# Patient Record
Sex: Male | Born: 1935 | Race: White | Hispanic: No | Marital: Married | Smoking: Former smoker
Health system: Southern US, Community
[De-identification: ages and names within clinical notes are randomized; demographics above are authoritative.]

## PROBLEM LIST (undated history)

## (undated) DIAGNOSIS — Z86010 Personal history of colonic polyps: Secondary | ICD-10-CM

## (undated) DIAGNOSIS — I2510 Atherosclerotic heart disease of native coronary artery without angina pectoris: Secondary | ICD-10-CM

## (undated) DIAGNOSIS — K5790 Diverticulosis of intestine, part unspecified, without perforation or abscess without bleeding: Secondary | ICD-10-CM

## (undated) DIAGNOSIS — R569 Unspecified convulsions: Secondary | ICD-10-CM

## (undated) DIAGNOSIS — E785 Hyperlipidemia, unspecified: Secondary | ICD-10-CM

## (undated) DIAGNOSIS — C801 Malignant (primary) neoplasm, unspecified: Secondary | ICD-10-CM

## (undated) DIAGNOSIS — I10 Essential (primary) hypertension: Secondary | ICD-10-CM

## (undated) HISTORY — DX: Essential (primary) hypertension: I10

## (undated) HISTORY — PX: PROSTATE SURGERY: SHX751

## (undated) HISTORY — DX: Personal history of colonic polyps: Z86.010

## (undated) HISTORY — DX: Unspecified convulsions: R56.9

## (undated) HISTORY — DX: Hyperlipidemia, unspecified: E78.5

## (undated) HISTORY — DX: Atherosclerotic heart disease of native coronary artery without angina pectoris: I25.10

## (undated) HISTORY — DX: Malignant (primary) neoplasm, unspecified: C80.1

## (undated) HISTORY — DX: Diverticulosis of intestine, part unspecified, without perforation or abscess without bleeding: K57.90

---

## 2000-09-30 DIAGNOSIS — I2510 Atherosclerotic heart disease of native coronary artery without angina pectoris: Secondary | ICD-10-CM

## 2000-09-30 HISTORY — DX: Atherosclerotic heart disease of native coronary artery without angina pectoris: I25.10

## 2001-05-19 LAB — HM COLONOSCOPY: HM Colonoscopy: NORMAL

## 2002-09-30 DIAGNOSIS — Z86010 Personal history of colonic polyps: Secondary | ICD-10-CM

## 2002-09-30 HISTORY — DX: Personal history of colonic polyps: Z86.010

## 2005-09-30 DIAGNOSIS — K5790 Diverticulosis of intestine, part unspecified, without perforation or abscess without bleeding: Secondary | ICD-10-CM

## 2005-09-30 HISTORY — DX: Diverticulosis of intestine, part unspecified, without perforation or abscess without bleeding: K57.90

## 2005-12-02 ENCOUNTER — Ambulatory Visit: Payer: Self-pay | Admitting: Unknown Physician Specialty

## 2006-03-14 LAB — HM COLONOSCOPY

## 2006-03-17 ENCOUNTER — Ambulatory Visit: Payer: Self-pay | Admitting: Gastroenterology

## 2008-03-25 ENCOUNTER — Ambulatory Visit: Payer: Self-pay | Admitting: Unknown Physician Specialty

## 2008-03-26 ENCOUNTER — Ambulatory Visit: Payer: Self-pay | Admitting: Unknown Physician Specialty

## 2008-04-20 ENCOUNTER — Ambulatory Visit: Payer: Self-pay | Admitting: Unknown Physician Specialty

## 2008-06-20 ENCOUNTER — Ambulatory Visit: Payer: Self-pay

## 2009-04-10 ENCOUNTER — Ambulatory Visit: Payer: Self-pay | Admitting: Unknown Physician Specialty

## 2009-08-04 ENCOUNTER — Ambulatory Visit: Payer: Self-pay | Admitting: Unknown Physician Specialty

## 2009-08-07 ENCOUNTER — Ambulatory Visit: Payer: Self-pay | Admitting: Unknown Physician Specialty

## 2011-09-23 IMAGING — NM NUCLEAR MEDICINE WHOLE BODY BONE SCINTIGRAPHY
4 series · 16 of 16 positions shown · non-contrast
Comparison: none

REASON FOR EXAM: prostate CA    eval for mets   abnormal LFT's  anemia
COMMENTS:

[Series 1000: statics · 2.40mm/px · 3 acquisitions, 6 frames shown]
[im 1/3]
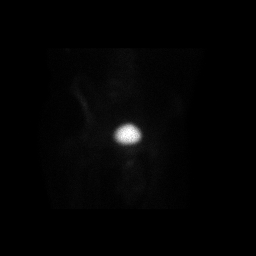
[im 1/3]
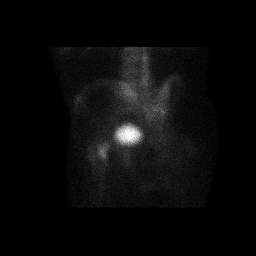
[im 2/3]
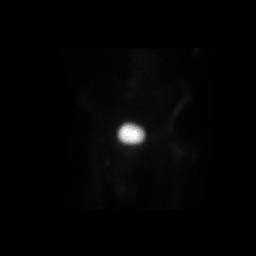
[im 2/3]
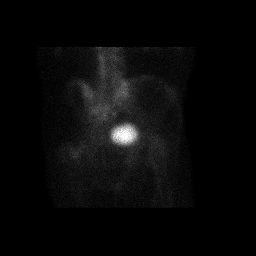
[im 3/3]
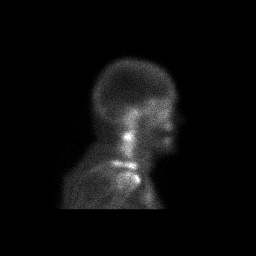
[im 3/3]
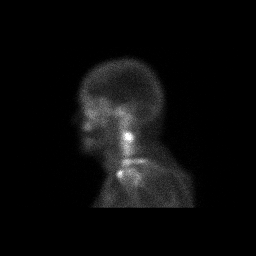

[Series 1000: statics (reformatted series) · 2.40mm/px · 3 acquisitions, 6 frames shown]
[im 1/3]
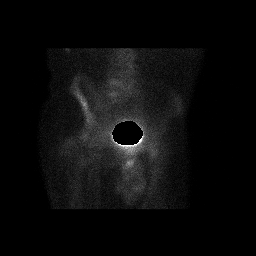
[im 1/3]
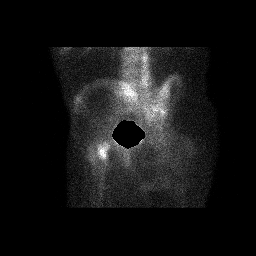
[im 2/3]
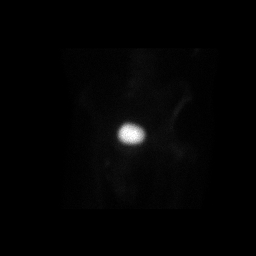
[im 2/3]
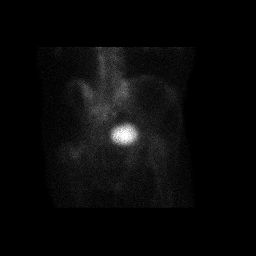
[im 3/3]
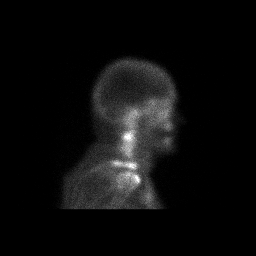
[im 3/3]
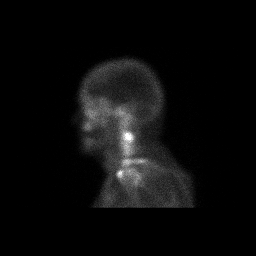

[Series 1000: 3 hr wholebody (reformatted series) · 2.40mm/px · 2 of 2 frames shown]
[frame 1/2]
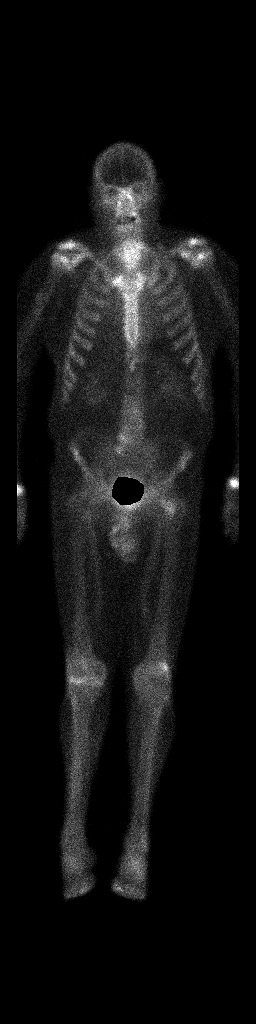
[frame 2/2]
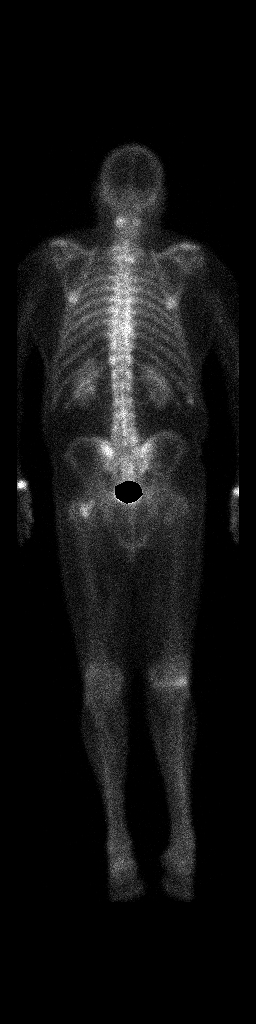

[Series 1000: 3 hr wholebody · 2.40mm/px · 2 of 2 frames shown]
[frame 1/2]
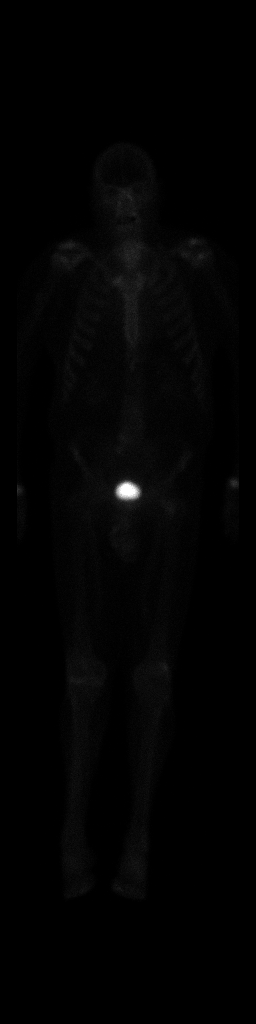
[frame 2/2]
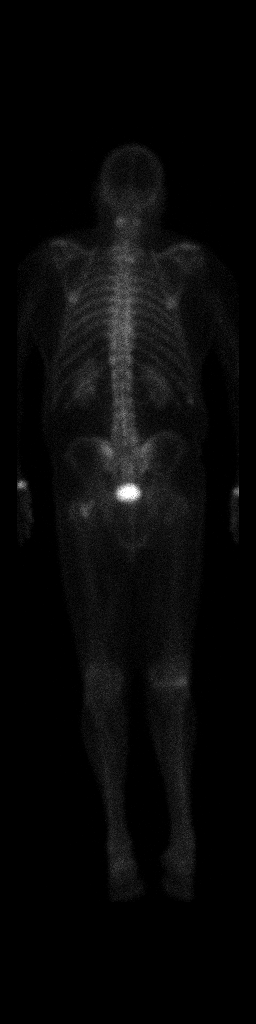

[16 of 16 positions shown; findings below may reference images not displayed]

PROCEDURE:     NM  - NM BONE WB 3 HR [DATE]  [DATE]

RESULT:     Following intravenous administration of 22.18 mCi technetium 99m
MDP, total body bone scan was performed. There is a nonspecific focal area
of increased tracer activity at the left femoral neck. Correlation with left
hip radiographs is recommended.

There are nonspecific foci of increased tracer activity in the lower lumbar
spine at L5 and S1. These are located laterally on the right and are most
compatible with degenerative change. Also noted is slight increase in tracer
activity at approximately T2 on the right and likewise compatible with
degenerative change. The distribution of tracer activity otherwise is
normal. Normal tracer activity is seen in both kidneys.
IMPRESSION: 1. There is a nonspecific focus of increased tracer activity in the left
femoral neck.
2. Incidental note is made of foci of increased tracer activity in the lower
lumbar spine and upper thoracic spine, most compatible with degenerative
change.

## 2011-11-20 ENCOUNTER — Encounter: Payer: Self-pay | Admitting: Internal Medicine

## 2011-11-20 ENCOUNTER — Ambulatory Visit (INDEPENDENT_AMBULATORY_CARE_PROVIDER_SITE_OTHER): Payer: Medicare Other | Admitting: Internal Medicine

## 2011-11-20 VITALS — BP 100/60 | HR 70 | Temp 97.8°F | Wt 157.0 lb

## 2011-11-20 DIAGNOSIS — C61 Malignant neoplasm of prostate: Secondary | ICD-10-CM

## 2011-11-20 DIAGNOSIS — I2510 Atherosclerotic heart disease of native coronary artery without angina pectoris: Secondary | ICD-10-CM

## 2011-11-20 DIAGNOSIS — Z79899 Other long term (current) drug therapy: Secondary | ICD-10-CM

## 2011-11-20 DIAGNOSIS — R569 Unspecified convulsions: Secondary | ICD-10-CM

## 2011-11-20 DIAGNOSIS — E785 Hyperlipidemia, unspecified: Secondary | ICD-10-CM

## 2011-11-20 DIAGNOSIS — I10 Essential (primary) hypertension: Secondary | ICD-10-CM

## 2011-11-20 DIAGNOSIS — C801 Malignant (primary) neoplasm, unspecified: Secondary | ICD-10-CM

## 2011-11-20 LAB — COMPREHENSIVE METABOLIC PANEL
ALT: 10 U/L (ref 0–53)
AST: 14 U/L (ref 0–37)
Alkaline Phosphatase: 45 U/L (ref 39–117)
CO2: 25 mEq/L (ref 19–32)
Creatinine, Ser: 0.9 mg/dL (ref 0.4–1.5)
Sodium: 137 mEq/L (ref 135–145)
Total Bilirubin: 0.4 mg/dL (ref 0.3–1.2)
Total Protein: 5.8 g/dL — ABNORMAL LOW (ref 6.0–8.3)

## 2011-11-20 NOTE — Progress Notes (Signed)
Subjective:    Patient ID: Todd Pierce, male    DOB: 10/25/1929, 78 y.o.   MRN: [id number]  HPI  78 yr old with history of metastatic Prostate CA ,  Treated currently with n hormonal suppression, on Xtandi , here to establish primary care. Prior PCP was [facility].  Primary oncologist is in [place],   Initially treated in 2002 , Gleason score  Of 9  per patient,  currently treated  By Dr Cheree Ditto in [place] (oncology),  Dr. Jessee Avers in [place],  orthopedic surgery planned for near future  To stabilize left hip at [facility] to stabilize hip which has bony mets.  (Dr Odis Luster)  .   History of idiopathic seizure disorder,  First episode occurred  2009 , managed with Keppra,  Neurologist in [place] [facility], lalso diagnosed with peripheral neuropathy causing  primary balance disorder and right foot drop. He has had 1 recent fall which occurred at home while using the oven.  No injuries sustained. Uses a cane  outside of the home in unfamiliar places .  He walks fairly well once up but has a few moments of orthostasis and vertigo with sudden changes in movement.  Had previous noted excessive salivation, which has improved.   No history of coughing or post prandial cough .  History of hypertension with reduced medications lately due to recurrent hypotension.  History of CAD  With  Known 95% blockage of LAD,  treated medically since 2002.  Followed locally by [facility].  No orthopnea, able to lie flat in bed.  Last CMEt and lipids checked by [facility] in Sept.  2012.  Past Medical History  Diagnosis Date  . prostate cancer   . Coronary artery disease, non-occlusive 2002    95% LAD lesion, managed medically  . Hypertension   . Hyperlipidemia   . Seizures    No current outpatient prescriptions on file prior to visit.        Review of Systems  Constitutional: Positive for fatigue. Negative for fever, chills, diaphoresis, activity change, appetite change and unexpected weight change.    HENT: Negative for hearing loss, ear pain, nosebleeds, congestion, sore throat, facial swelling, rhinorrhea, sneezing, drooling, mouth sores, trouble swallowing, neck pain, neck stiffness, dental problem, voice change, postnasal drip, sinus pressure, tinnitus and ear discharge.   Eyes: Negative for photophobia, pain, discharge, redness, itching and visual disturbance.  Respiratory: Negative for apnea, cough, choking, chest tightness, shortness of breath, wheezing and stridor.   Cardiovascular: Negative for chest pain, palpitations and leg swelling.  Gastrointestinal: Negative for nausea, vomiting, abdominal pain, diarrhea, constipation, blood in stool, abdominal distention, anal bleeding and rectal pain.  Genitourinary: Negative for dysuria, urgency, frequency, hematuria, flank pain, decreased urine volume, scrotal swelling, difficulty urinating and testicular pain.  Musculoskeletal: Positive for back pain. Negative for myalgias, joint swelling, arthralgias and gait problem.  Skin: Negative for color change, rash and wound.  Neurological: Positive for seizures and light-headedness. Negative for dizziness, tremors, syncope, speech difficulty, weakness, numbness and headaches.  Psychiatric/Behavioral: Positive for confusion. Negative for suicidal ideas, hallucinations, behavioral problems, sleep disturbance, dysphoric mood, decreased concentration and agitation. The patient is not nervous/anxious.        Objective:   Physical Exam  Constitutional: He is oriented to person, place, and time.  HENT:  Head: Normocephalic and atraumatic.  Mouth/Throat: Oropharynx is clear and moist.  Eyes: Conjunctivae and EOM are normal.  Neck: Normal range of motion. Neck supple. No JVD present. No thyromegaly  present.  Cardiovascular: Normal rate, regular rhythm and normal heart sounds.   Pulmonary/Chest: Effort normal and breath sounds normal. He has no wheezes. He has no rales.  Abdominal: Soft. Bowel sounds  are normal. He exhibits no mass. There is no tenderness. There is no rebound.  Musculoskeletal: Normal range of motion. He exhibits no edema.  Neurological: He is alert and oriented to person, place, and time.  Skin: Skin is warm and dry.  Psychiatric: He has a normal mood and affect.       Assessment & Plan:   Coronary artery disease, non-occlusive Followed locally by Jamse Mead for 95% lession of LAD found in 2002.  Continue statin, asa, beta blocker as tolerated by bp.  Fasting lipids due. He has no history of recent chest pain or orthopnea. Unclear why is is not on an ACE Inhibitor but may be due to hypotension reported earlier.  Records requested.   Hypertension Currently on beta blocker only.    Hyperlipidemia Managed with low dose pravastatin ,  Goal LDL < 70.  Lipids and CMET due   Prostate cancer, primary, with metastasis from prostate to other site With bony mets, managed with hormonal suppressive therapy.  He has been referred for left hip surgery to stabilize bone due to mets, but has not set this up yet. Records requested.   Seizures Secondary to metaastatic prostate CA, presumed.  No recent seizure activity. Records from [facility] requested,  Continue Keppra.     Updated Medication List Outpatient Encounter Prescriptions as of 11/20/2011  Medication Sig Dispense Refill  . aspirin 81 MG tablet Take 81 mg by mouth daily.      . cabergoline (DOSTINEX) 0.5 MG tablet Take one by mouth on Monday and Thursday.      . Calcium Citrate 250 MG TABS Take 3 by mouth twice a day      . Cholecalciferol (VITAMIN D-3) 5000 UNITS TABS Take one by mouth twice a day.      . Denosumab (XGEVA [place] [facility]) One injection monthly.      . dutasteride (AVODART) 0.5 MG capsule Take 0.5 mg by mouth daily.      Marland Kitchen estradiol (CLIMARA - DOSED IN MG/24 HR) 0.1 mg/24hr Wear 3 patches, add one and remove one daily      . hydrocortisone (CORTEF) 20 MG tablet Take one tablet in the morning and one  half tablet in the evening      . indomethacin (INDOCIN) 50 MG capsule Take by mouth as needed.      . Lecithin 1200 MG CAPS Take one by mouth daily      . leuprolide (LUPRON) 22.5 MG injection Inject 22.5 mg into the muscle every 3 (three) months.      . levETIRAcetam (KEPPRA) 500 MG tablet Take 500 mg by mouth every 12 (twelve) hours.      . Loratadine 10 MG CAPS Take one by mouth daily      . metoprolol succinate (TOPROL-XL) 25 MG 24 hr tablet Take one half by mouth daily      . Pomegranate, Pun[place] granatum, (POMEGRANATE PO) Take by mouth.      . pravastatin (PRAVACHOL) 20 MG tablet Take 20 mg by mouth daily.      Marland Kitchen Resveratrol 250 MG CAPS Take by mouth.      Judi Saa OIL Take 3 capsules by mouth twice a day- total of 2400 mg's.      . sargramostim (LEUKINE) 250 MCG SOLR Inject  into the skin daily.      . TURMERIC CURCUMIN PO Take 400 mg's daily      . ursodiol (ACTIGALL) 300 MG capsule Take 300 mg by mouth 2 (two) times daily.

## 2011-11-20 NOTE — Patient Instructions (Signed)
Try Simply Saline twice daily to flush sinuses  twice daily to prevent sinus infection   We are checking your liver and kidney function and will call you with the results.

## 2011-11-23 ENCOUNTER — Encounter: Payer: Self-pay | Admitting: Internal Medicine

## 2011-11-23 DIAGNOSIS — I10 Essential (primary) hypertension: Secondary | ICD-10-CM | POA: Insufficient documentation

## 2011-11-23 DIAGNOSIS — C61 Malignant neoplasm of prostate: Secondary | ICD-10-CM | POA: Insufficient documentation

## 2011-11-23 DIAGNOSIS — I2510 Atherosclerotic heart disease of native coronary artery without angina pectoris: Secondary | ICD-10-CM | POA: Insufficient documentation

## 2011-11-23 DIAGNOSIS — R569 Unspecified convulsions: Secondary | ICD-10-CM | POA: Insufficient documentation

## 2011-11-23 DIAGNOSIS — E785 Hyperlipidemia, unspecified: Secondary | ICD-10-CM | POA: Insufficient documentation

## 2011-11-23 NOTE — Assessment & Plan Note (Signed)
Currently on beta blocker only.

## 2011-11-23 NOTE — Assessment & Plan Note (Signed)
Managed with low dose pravastatin ,  Goal LDL < 70.  Lipids and CMET due

## 2011-11-23 NOTE — Assessment & Plan Note (Addendum)
Followed locally by Jamse Mead for 95% lession of LAD found in 2002.  Continue statin, asa, beta blocker as tolerated by bp.  Fasting lipids due. He has no history of recent chest pain or orthopnea. Unclear why is is not on an ACE Inhibitor but may be due to hypotension reported earlier.  Records requested.

## 2011-11-23 NOTE — Assessment & Plan Note (Signed)
Secondary to metaastatic prostate CA, presumed.  No recent seizure activity. Records from [facility] Neurology requested,  Continue Keppra.

## 2011-11-23 NOTE — Assessment & Plan Note (Signed)
With bony mets, managed with hormonal suppressive therapy.  He has been referred for left hip surgery to stabilize bone due to mets, but has not set this up yet. Records requested.

## 2012-04-15 ENCOUNTER — Other Ambulatory Visit: Payer: Self-pay | Admitting: *Deleted

## 2012-04-15 MED ORDER — PRAVASTATIN SODIUM 20 MG PO TABS: 20.0000 mg | ORAL_TABLET | Freq: Every day | ORAL | Status: DC

## 2012-04-15 NOTE — Telephone Encounter (Signed)
Pt called for refill of Pravastatin. Rx sent to pharmacy, pt informed.

## 2012-05-19 ENCOUNTER — Encounter: Payer: Self-pay | Admitting: Internal Medicine

## 2012-05-19 ENCOUNTER — Ambulatory Visit (INDEPENDENT_AMBULATORY_CARE_PROVIDER_SITE_OTHER): Payer: Medicare Other | Admitting: Internal Medicine

## 2012-05-19 VITALS — BP 102/58 | HR 64 | Temp 97.7°F | Resp 14 | Wt 169.0 lb

## 2012-05-19 DIAGNOSIS — R569 Unspecified convulsions: Secondary | ICD-10-CM

## 2012-05-19 DIAGNOSIS — I10 Essential (primary) hypertension: Secondary | ICD-10-CM

## 2012-05-19 DIAGNOSIS — R5381 Other malaise: Secondary | ICD-10-CM

## 2012-05-19 DIAGNOSIS — Z800 Family history of malignant neoplasm of digestive organs: Secondary | ICD-10-CM

## 2012-05-19 DIAGNOSIS — R5383 Other fatigue: Secondary | ICD-10-CM

## 2012-05-19 DIAGNOSIS — E785 Hyperlipidemia, unspecified: Secondary | ICD-10-CM

## 2012-05-19 DIAGNOSIS — C61 Malignant neoplasm of prostate: Secondary | ICD-10-CM

## 2012-05-19 DIAGNOSIS — C801 Malignant (primary) neoplasm, unspecified: Secondary | ICD-10-CM

## 2012-05-19 NOTE — Patient Instructions (Addendum)
We are going to suspend your metoprolol because your blood pressure is bordering on too low   You may resume it for bp > 140   You have no signs of diabetes by your last several serum glucose measurements  Return in 6 months, sooner if your blood pressure becomes a problem

## 2012-05-19 NOTE — Progress Notes (Signed)
Patient ID: Todd Pierce, male   DOB: October 27, 1929, 78 y.o.   MRN: [id number]  Patient Active Problem List  Diagnosis  . Coronary artery disease, non-occlusive  . Hypertension  . Hyperlipidemia  . Seizures  . Prostate cancer, primary, with metastasis from prostate to other site    Subjective:  CC:   Chief Complaint  Patient presents with  . Follow-up    6 month    HPI:   Todd Pierce a 78 y.o. male who presents Followup on chronic conditions. He is accompanied by his wife today who reports independently from him that he has been having some control with his mentation. She describes it as a mental fog affecting short-term memory and attributes it to the treatment he is getting for metastatic  prostate cancer. His prostate cancer has metastasized to bone and he has been receiving xtandi since he had progression on Xgeva. His therapies directed by Dr. Izola Price and Dr. Cheree Ditto.  He has been having nocturia  x 3 but takes a nap during day to catch up on sleep .  Appetite ok   Has not lost weight,  [facility] focuses this is walking and doing upper body weights. He is aware that Lupron can cause osteoporosis and has been doing weight bearing exercises intentionally.he is overdue for his his regular colonoscopy screening but is considering foregoing this procedure. He has 2 brothers who were diagnosed with colon cancer in their 73s.  No bowel problems except constipation occasionally,  Uses sennakot daily to move bowels.  2) right foot swells,  Responds to elevation .  History of prior surgeries on his right knee DJD with Synvisc injections,.  Not in constant pain .  Peripheral neuropathy chronic.     Past Medical History  Diagnosis Date  . prostate cancer   . Coronary artery disease, non-occlusive 2002    95% LAD lesion, managed medically  . Hypertension   . Hyperlipidemia   . Seizures     Past Surgical History  Procedure Date  . Prostate surgery           The following portions of the patient's history were reviewed and updated as appropriate: Allergies, current medications, and problem list.    Review of Systems:   A comprehensive ROS was done and positive for nocturia and daytime fatigue.   The rest was negative.     History   Social History  . Marital Status: Married    Spouse Name: N/A    Number of Children: N/A  . Years of Education: N/A   Occupational History  . Not on file.   Social History Main Topics  . Smoking status: Former Smoker    Types: Cigarettes    Quit date: 05/19/1962  . Smokeless tobacco: Former Neurosurgeon    Quit date: 05/19/1952  . Alcohol Use: Yes  . Drug Use: No  . Sexually Active: Not on file   Other Topics Concern  . Not on file   Social History Narrative  . No narrative on file    Objective:  BP 102/58  Pulse 64  Temp 97.7 F (36.5 C) (Oral)  Resp 14  Wt 169 lb (76.658 kg)  SpO2 97%  General appearance: alert, cooperative and appears stated age Ears: normal TM's and external ear canals both ears Throat: lips, mucosa, and tongue normal; teeth and gums normal Neck: no adenopathy, no carotid bruit, supple, symmetrical, trachea midline and thyroid not enlarged, symmetric, no  tenderness/mass/nodules Back: symmetric, no curvature. ROM normal. No CVA tenderness. Lungs: clear to auscultation bilaterally Heart: regular rate and rhythm, S1, S2 normal, no murmur, click, rub or gallop Abdomen: soft, non-tender; bowel sounds normal; no masses,  no organomegaly Pulses: 2+ and symmetric Skin: Skin color, texture, turgor normal. No rashes or lesions Lymph nodes: Cervical, supraclavicular, and axillary nodes normal.  Assessment and Plan:  Hypertension Well controlled on current regimen. In fact he seems to his systolic pressure of 105 is high for him. I decided to stop his a walker today as this maybe contributing to his mental fatigue and physical fatigue.  Hyperlipidemia Managed  with pravastatin goal LDL less than 100. He had liver function test done in July which were normal.  Seizures No recent seizure activity. Continue current medications.  Fatigue Improving. He had a CBC in July which was normal. We are stopping his metoprolol today since his blood pressures are well controlled. This should help.  Prostate cancer, primary, with metastasis from prostate to other site With bone metastasis. He underwent fixation of his left femur to prevent pathologic fracture. Is currently receiving Xtandiby his oncologist which has slowed the rise in his PSA.  Family history of colon cancer requiring screening colonoscopy He is scheduled to see his Dr. Marva Panda next month to discuss colonoscopy. Given his metastatic prostate cancer I do not think you who will serve to continue screening for colon cancer as he was having difficult time tolerating treatment.   Updated Medication List Outpatient Encounter Prescriptions as of 05/19/2012  Medication Sig Dispense Refill  . aspirin 81 MG tablet Take 81 mg by mouth daily.      . cabergoline (DOSTINEX) 0.5 MG tablet Take one by mouth on Monday and Thursday.      . Calcium Citrate 250 MG TABS Take 3 by mouth twice a day      . Cholecalciferol (VITAMIN D-3) 5000 UNITS TABS Take one by mouth twice a day.      . Denosumab (XGEVA [place]) One injection monthly.      . dutasteride (AVODART) 0.5 MG capsule Take 0.5 mg by mouth daily.      . Enzalutamide (XTANDI PO) Take 160 mg by mouth.      . estradiol (CLIMARA - DOSED IN MG/24 HR) 0.1 mg/24hr Wear 3 patches, add one and remove one daily      . indomethacin (INDOCIN) 50 MG capsule Take by mouth as needed.      . Lecithin 1200 MG CAPS Take one by mouth daily      . leuprolide (LUPRON) 22.5 MG injection Inject 22.5 mg into the muscle every 3 (three) months.      . levETIRAcetam (KEPPRA) 500 MG tablet Take 500 mg by mouth every 12 (twelve) hours.      . Loratadine 10 MG CAPS Take one by mouth  daily      . Pomegranate, Punica granatum, (POMEGRANATE PO) Take by mouth.      . pravastatin (PRAVACHOL) 20 MG tablet Take 1 tablet (20 mg total) by mouth daily.  30 tablet  5  . Resveratrol 250 MG CAPS Take by mouth.      Judi Saa OIL Take 3 capsules by mouth twice a day- total of 2400 mg's.      . sargramostim (LEUKINE) 250 MCG SOLR Inject into the skin daily.      . TURMERIC CURCUMIN PO Take 400 mg's daily      . ursodiol (ACTIGALL) 300 MG  capsule Take 300 mg by mouth 2 (two) times daily.      Marland Kitchen DISCONTD: metoprolol succinate (TOPROL-XL) 25 MG 24 hr tablet Take one half by mouth daily      . DISCONTD: hydrocortisone (CORTEF) 20 MG tablet Take one tablet in the morning and one half tablet in the evening

## 2012-05-20 ENCOUNTER — Encounter: Payer: Self-pay | Admitting: Internal Medicine

## 2012-05-20 DIAGNOSIS — R5383 Other fatigue: Secondary | ICD-10-CM | POA: Insufficient documentation

## 2012-05-20 DIAGNOSIS — Z800 Family history of malignant neoplasm of digestive organs: Secondary | ICD-10-CM | POA: Insufficient documentation

## 2012-05-20 NOTE — Assessment & Plan Note (Addendum)
Managed with pravastatin goal LDL less than 100. He had liver function test done in July which were normal.

## 2012-05-20 NOTE — Assessment & Plan Note (Signed)
Well controlled on current regimen. In fact he seems to his systolic pressure of 105 is high for him. I decided to stop his a walker today as this maybe contributing to his mental fatigue and physical fatigue.

## 2012-05-20 NOTE — Assessment & Plan Note (Signed)
No recent seizure activity. Continue current medications.

## 2012-05-20 NOTE — Assessment & Plan Note (Signed)
Improving. He had a CBC in July which was normal. We are stopping his metoprolol today since his blood pressures are well controlled. This should help.

## 2012-05-20 NOTE — Assessment & Plan Note (Signed)
With bone metastasis. He underwent fixation of his left femur to prevent pathologic fracture. Is currently receiving Xtandiby his oncologist which has slowed the rise in his PSA.

## 2012-05-20 NOTE — Assessment & Plan Note (Signed)
He is scheduled to see his Dr. Marva Panda next month to discuss colonoscopy. Given his metastatic prostate cancer I do not think you who will serve to continue screening for colon cancer as he was having difficult time tolerating treatment.

## 2012-08-14 ENCOUNTER — Telehealth: Payer: Self-pay | Admitting: Internal Medicine

## 2012-08-14 ENCOUNTER — Encounter: Payer: Self-pay | Admitting: Internal Medicine

## 2012-08-14 ENCOUNTER — Ambulatory Visit (INDEPENDENT_AMBULATORY_CARE_PROVIDER_SITE_OTHER): Payer: Medicare Other | Admitting: Internal Medicine

## 2012-08-14 VITALS — BP 98/60 | HR 67 | Temp 97.3°F | Resp 12 | Ht 69.0 in | Wt 152.8 lb

## 2012-08-14 DIAGNOSIS — R5381 Other malaise: Secondary | ICD-10-CM

## 2012-08-14 DIAGNOSIS — D649 Anemia, unspecified: Secondary | ICD-10-CM | POA: Insufficient documentation

## 2012-08-14 DIAGNOSIS — Z86010 Personal history of colonic polyps: Secondary | ICD-10-CM | POA: Insufficient documentation

## 2012-08-14 DIAGNOSIS — C801 Malignant (primary) neoplasm, unspecified: Secondary | ICD-10-CM

## 2012-08-14 DIAGNOSIS — C61 Malignant neoplasm of prostate: Secondary | ICD-10-CM

## 2012-08-14 DIAGNOSIS — K5790 Diverticulosis of intestine, part unspecified, without perforation or abscess without bleeding: Secondary | ICD-10-CM | POA: Insufficient documentation

## 2012-08-14 DIAGNOSIS — R5383 Other fatigue: Secondary | ICD-10-CM

## 2012-08-14 DIAGNOSIS — E2740 Unspecified adrenocortical insufficiency: Secondary | ICD-10-CM | POA: Insufficient documentation

## 2012-08-14 DIAGNOSIS — Z800 Family history of malignant neoplasm of digestive organs: Secondary | ICD-10-CM

## 2012-08-14 DIAGNOSIS — R350 Frequency of micturition: Secondary | ICD-10-CM | POA: Insufficient documentation

## 2012-08-14 DIAGNOSIS — T380X5A Adverse effect of glucocorticoids and synthetic analogues, initial encounter: Secondary | ICD-10-CM

## 2012-08-14 DIAGNOSIS — E2749 Other adrenocortical insufficiency: Secondary | ICD-10-CM

## 2012-08-14 LAB — POCT URINALYSIS DIPSTICK
Bilirubin, UA: NEGATIVE
Glucose, UA: NEGATIVE
Spec Grav, UA: 1.02
Urobilinogen, UA: 0.2

## 2012-08-14 LAB — POCT UA - GLUCOSE/PROTEIN: Protein, UA: 30

## 2012-08-14 MED ORDER — CIPROFLOXACIN HCL 250 MG PO TABS: 250.0000 mg | ORAL_TABLET | Freq: Two times a day (BID) | ORAL | Status: DC

## 2012-08-14 MED ORDER — ABIRATERONE ACETATE 250 MG PO TABS: 1000.0000 mg | ORAL_TABLET | Freq: Every day | ORAL | Status: AC

## 2012-08-14 MED ORDER — PREDNISONE 5 MG PO TABS: 5.0000 mg | ORAL_TABLET | Freq: Two times a day (BID) | ORAL | Status: AC

## 2012-08-14 NOTE — Telephone Encounter (Signed)
Wanted to speak with a nurse about upcoming cancer treatments

## 2012-08-14 NOTE — Assessment & Plan Note (Addendum)
Diagnosed with Stage II in 2002, revised Gleason score of 9 .  Long list of treatments including XRT in 2012 , Lupron and Denusomab in 2012, followed by [facility] with progression of bony mets .  To start Xofigo in Dec.

## 2012-08-14 NOTE — Telephone Encounter (Signed)
Returned call to patietn's wife re use of cipro concurrently with Zytiga.  She was under the imperession from her pharmacist that Todd Pierce could not be combined with any antibacterials or antifungals.  Told her not to start the cipro or the urinary frequency medications until urine culture was resulted and we could determine if there were any contraindications to concurrent use.

## 2012-08-14 NOTE — Assessment & Plan Note (Signed)
His last colonoscopy was 2007 and normal.  followup every 3 years was advised but given his prognosis we have not pursued this.

## 2012-08-14 NOTE — Telephone Encounter (Signed)
Spoke  to patient he wanted to talk about a blood transfusion, advised patient to schedule appt to discuss with Dr. Darrick Huntsman

## 2012-08-14 NOTE — Assessment & Plan Note (Addendum)
Patient is concerned he needs a blood transfusion.

## 2012-08-14 NOTE — Telephone Encounter (Signed)
Gave patient appt for today.

## 2012-08-14 NOTE — Patient Instructions (Addendum)
Trial of different bladder agents to help your frequency  If your urine culture suggests UTI,  I will call in an antibiotic   Referral to Hematology local to streamline any future need for transfusion.

## 2012-08-14 NOTE — Telephone Encounter (Signed)
Please give patient an appt this afternoon.  I cannot order a blood transfusion without a CBC and he has not been seen since August.

## 2012-08-14 NOTE — Assessment & Plan Note (Addendum)
His UA suggests UTI ,  And since he has been suppressed with prednisone,  Will treat empirically with ciprfloxacin for two weeks.  Samples of vesicare, enablex and toviaz given  To try if the frequency does not imporve with treatment of UTI>

## 2012-08-14 NOTE — Progress Notes (Addendum)
Patient ID: Todd Pierce, male   DOB: 01/04/1930, 78 y.o.   MRN: [id number]  Patient Active Problem List  Diagnosis  . Coronary artery disease, non-occlusive  . Hypertension  . Hyperlipidemia  . Seizures  . Prostate cancer, primary, with metastasis from prostate to other site  . Fatigue  . Family history of colon cancer requiring screening colonoscopy  . Anemia  . Hx of adenomatous colonic polyps  . Diverticulosis  . Urinary frequency  . Steroid-induced adrenal suppression    Subjective:  CC:   No chief complaint on file.   HPI:   Todd Dupreeis a 78 y.o. male who presents  for follow up on prostate Ca and associated symptoms.  He has had progression of bony mets on [place] and is preparing to start  Xofigo, a new liquid radium infusion on Dec 12 with his oncologist in [place]. The treatment, if tolerated will take 6 months.  The radiation oncologist has recommended local surveillance of his anemia since he may need periodic transfusions.  His preprocedure hgb was 9.8 drawn on Nov 12,  Down from 10.7 in July.  2) urinary urgency with incomplete emptying and nocturia x 5.  Sleeps in recliner due to back pain  from mets. Using a bedside commode which has greatly aided since he has significant neuropathy  And moves very slowly.  However, the future treatments will make his urine radioactive and he  Has been advised not to use a bedside commode.   Past Medical History  Diagnosis Date  . prostate cancer   . Coronary artery disease, non-occlusive 2002    95% LAD lesion, managed medically  . Hypertension   . Hyperlipidemia   . Seizures   . Hx of adenomatous colonic polyps 2004    none seen on 2007bcolonoscopy  . Diverticulosis 2007    Past Surgical History  Procedure Date  . Prostate surgery          The following portions of the patient's history were reviewed and updated as appropriate: Allergies, current medications, and problem list.    Review of Systems:   12 Pt   review of systems was negative except those addressed in the HPI,     History   Social History  . Marital Status: Married    Spouse Name: N/A    Number of Children: N/A  . Years of Education: N/A   Occupational History  . Not on file.   Social History Main Topics  . Smoking status: Former Smoker    Types: Cigarettes    Quit date: 05/19/1962  . Smokeless tobacco: Former Neurosurgeon    Quit date: 05/19/1952  . Alcohol Use: Yes  . Drug Use: No  . Sexually Active: Not on file   Other Topics Concern  . Not on file   Social History Narrative  . No narrative on file    Objective:  BP 98/60  Pulse 67  Temp 97.3 F (36.3 C) (Oral)  Resp 12  Ht 5\' 9"  (1.753 m)  Wt 152 lb 12 oz (69.287 kg)  BMI 22.56 kg/m2  SpO2 98%  General appearance: alert, cooperative and appears stated age Ears: normal TM's and external ear canals both ears Throat: lips, mucosa, and tongue normal; teeth and gums normal Neck: no adenopathy, no carotid bruit, supple, symmetrical, trachea midline and thyroid not enlarged, symmetric, no tenderness/mass/nodules Back: symmetric, no curvature. ROM normal. No CVA tenderness. Lungs: clear to auscultation bilaterally Heart: regular rate and rhythm, S1, S2 normal, no  murmur, click, rub or gallop Abdomen: soft, non-tender; bowel sounds normal; no masses,  no organomegaly Pulses: 2+ and symmetric Skin: Skin color, texture, turgor normal. No rashes or lesions Lymph nodes: Cervical, supraclavicular, and axillary nodes normal.  Assessment and Plan:  Prostate cancer, primary, with metastasis from prostate to other site Diagnosed with Stage II in 2002, revised Gleason score of 9 .  Long list of treatments including XRT in 2012 , Lupron and Denusomab in 2012, followed by [facility] with progression of bony mets .  To start Xofigo in Dec.    Fatigue Patient is concerned he needs a blood transfusion.    Anemia hgb was 10.8 by outside labs in July and is now 9.8, likely  secondary to metastatic prostate Ca or side effect of his treatment.  Referral to [facility] for local follow up in the event that his future radium infusions cause rapid drops in counts needing transfusions.   Urinary frequency His UA suggests UTI ,  And since he has been suppressed with prednisone,  Will treat empirically with ciprfloxacin for two weeks.  Samples of vesicare, enablex and Gala Murdoch given  To try if the frequency does not imporve with treatment of UTI>   Family history of colon cancer requiring screening colonoscopy His last colonoscopy was 2007 and normal.  followup every 3 years was advised but given his prognosis we have not pursued this.   Steroid-induced adrenal suppression He is taking 10 mg of prednisone daily as adjunctive therapy to the Zytiga.  Advised patient and wife to let any ER or Urgent care  physician know this .    Updated Medication List Outpatient Encounter Prescriptions as of 08/14/2012  Medication Sig Dispense Refill  . abiraterone Acetate (ZYTIGA) 250 MG tablet Take 4 tablets (1,000 mg total) by mouth daily. Take on an empty stomach 1 hour before or 2 hours after a meal  120 tablet  0  . aspirin 81 MG tablet Take 81 mg by mouth daily.      . cabergoline (DOSTINEX) 0.5 MG tablet Take one by mouth on Monday and Thursday.      . Calcium Citrate 250 MG TABS Take 3 by mouth twice a day      . Cholecalciferol (VITAMIN D-3) 5000 UNITS TABS Take one by mouth twice a day.      . ciprofloxacin (CIPRO) 250 MG tablet Take 1 tablet (250 mg total) by mouth 2 (two) times daily.  28 tablet  0  . Denosumab (XGEVA [place]) One injection monthly.      . dutasteride (AVODART) 0.5 MG capsule Take 0.5 mg by mouth daily.      Marland Kitchen estradiol (CLIMARA - DOSED IN MG/24 HR) 0.1 mg/24hr Wear 3 patches, add one and remove one daily      . indomethacin (INDOCIN) 50 MG capsule Take by mouth as needed.      . Lecithin 1200 MG CAPS Take one by mouth daily      . leuprolide (LUPRON) 22.5 MG  injection Inject 22.5 mg into the muscle every 3 (three) months.      . levETIRAcetam (KEPPRA) 500 MG tablet Take 500 mg by mouth every 12 (twelve) hours.      . Loratadine 10 MG CAPS Take one by mouth daily      . Pomegranate, [place] granatum, (POMEGRANATE PO) Take by mouth.      . pravastatin (PRAVACHOL) 20 MG tablet Take 1 tablet (20 mg total) by mouth daily.  30 tablet  5  . predniSONE (DELTASONE) 5 MG tablet Take 1 tablet (5 mg total) by mouth 2 (two) times daily.  60 tablet  3  . Resveratrol 250 MG CAPS Take by mouth.      Judi Saa OIL Take 3 capsules by mouth twice a day- total of 2400 mg's.      . TURMERIC CURCUMIN PO Take 400 mg's daily      . ursodiol (ACTIGALL) 300 MG capsule Take 300 mg by mouth 2 (two) times daily.      . [DISCONTINUED] Enzalutamide (XTANDI PO) Take 160 mg by mouth.      . [DISCONTINUED] sargramostim (LEUKINE) 250 MCG SOLR Inject into the skin daily.

## 2012-08-14 NOTE — Assessment & Plan Note (Addendum)
hgb was 10.8 by outside labs in July and is now 9.8, likely secondary to metastatic prostate Ca or side effect of his treatment.  Referral to [facility] for local follow up in the event that his future radium infusions cause rapid drops in counts needing transfusions.

## 2012-08-14 NOTE — Telephone Encounter (Signed)
Patient needing a call from Dr. Darrick Huntsman thinking he may have to have a blood transfusion.

## 2012-08-14 NOTE — Telephone Encounter (Signed)
Spoke to patient spouse, she will have him to start the Cipro. I will call back on Monday to follow up with how he is doing.

## 2012-08-14 NOTE — Telephone Encounter (Signed)
His urine does suggest he has a uti and I dont' like the idea of  him gong the whole weekend without antibiotics and starting a medication that is going to retain his urine so I am advising that he start cipro.  I have called rx to his local pharmacy

## 2012-08-14 NOTE — Assessment & Plan Note (Addendum)
He is taking 10 mg of prednisone daily as adjunctive therapy to the Zytiga.  Advised patient and wife to let any ER or Urgent care  physician know this .

## 2012-08-17 ENCOUNTER — Encounter: Payer: Self-pay | Admitting: Internal Medicine

## 2012-08-17 ENCOUNTER — Ambulatory Visit: Payer: Medicare Other | Admitting: Internal Medicine

## 2012-08-17 LAB — URINE CULTURE

## 2012-08-24 ENCOUNTER — Other Ambulatory Visit (INDEPENDENT_AMBULATORY_CARE_PROVIDER_SITE_OTHER): Payer: Medicare Other | Admitting: *Deleted

## 2012-08-24 DIAGNOSIS — N390 Urinary tract infection, site not specified: Secondary | ICD-10-CM

## 2012-08-24 LAB — POCT URINALYSIS DIPSTICK
Ketones, UA: NEGATIVE
Leukocytes, UA: NEGATIVE
Nitrite, UA: NEGATIVE
Protein, UA: NEGATIVE
pH, UA: 6

## 2012-10-14 ENCOUNTER — Ambulatory Visit (INDEPENDENT_AMBULATORY_CARE_PROVIDER_SITE_OTHER): Payer: 59 | Admitting: Internal Medicine

## 2012-10-14 ENCOUNTER — Encounter: Payer: Self-pay | Admitting: Internal Medicine

## 2012-10-14 VITALS — BP 102/60 | HR 77 | Temp 98.1°F | Resp 16 | Wt 153.5 lb

## 2012-10-14 DIAGNOSIS — L8992 Pressure ulcer of unspecified site, stage 2: Secondary | ICD-10-CM

## 2012-10-14 DIAGNOSIS — L89302 Pressure ulcer of unspecified buttock, stage 2: Secondary | ICD-10-CM

## 2012-10-14 DIAGNOSIS — L89309 Pressure ulcer of unspecified buttock, unspecified stage: Secondary | ICD-10-CM

## 2012-10-14 DIAGNOSIS — K5900 Constipation, unspecified: Secondary | ICD-10-CM

## 2012-10-14 MED ORDER — SODIUM CHLORIDE 0.9 % IR SOLN: 1000.0000 mL | Freq: Once | Status: AC

## 2012-10-14 NOTE — Progress Notes (Signed)
Patient ID: Todd Pierce, male   DOB: 07/03/1930, 79 y.o.   MRN: [id number]    Patient Active Problem List  Diagnosis  . Coronary artery disease, non-occlusive  . Hypertension  . Hyperlipidemia  . Seizures  . Prostate cancer, primary, with metastasis from prostate to other site  . Family history of colon cancer requiring screening colonoscopy  . Anemia  . Hx of adenomatous colonic polyps  . Diverticulosis  . Urinary frequency  . Steroid-induced adrenal suppression  . Decubitus ulcer of buttock, stage 2  . Constipation    Subjective:  CC:   Chief Complaint  Patient presents with  . Pressure sore    HPI:   Todd Dupreeis a 79 y.o. male who presents Followup on recent UTI. Patient was diagnosed with UTI at last visit and was treated with ciprofloxacin after consideration of the available alternatives given his current chemotherapy. He subsequently was hospitalized at [facility] for generalized weakness and severe constipation. She believes that his profound weakness may have been due to the use of ciprofloxacin. His constipation was treated with enemas and liquid cathartics. He did not require manual disimpaction.  2)  recent development of sacral decubitus ulcer. Wife and patient had noted one month or more of a persistent ulcerated area on right buttock near the natal cleft.  His wife has been treating it with silver Silvadene without any major improvement. She has caused more skin trauma from adhesive bandages and has switched to non-he's in bandages. Patient does not note any pain. He has not been having a diarrhea he has had some constipation recently. O\    Past Medical History  Diagnosis Date  . prostate cancer   . Coronary artery disease, non-occlusive 2002    95% LAD lesion, managed medically  . Hypertension   . Hyperlipidemia   . Seizures   . Hx of adenomatous colonic polyps 2004    none seen on 2007bcolonoscopy  . Diverticulosis 2007    Past Surgical History    Procedure Date  . Prostate surgery          The following portions of the patient's history were reviewed and updated as appropriate: Allergies, current medications, and problem list.    Review of Systems:   12 Pt  review of systems was negative except those addressed in the HPI,     History   Social History  . Marital Status: Married    Spouse Name: N/A    Number of Children: N/A  . Years of Education: N/A   Occupational History  . Not on file.   Social History Main Topics  . Smoking status: Former Smoker    Types: Cigarettes    Quit date: 05/19/1962  . Smokeless tobacco: Former Neurosurgeon    Quit date: 05/19/1952  . Alcohol Use: Yes  . Drug Use: No  . Sexually Active: Not on file   Other Topics Concern  . Not on file   Social History Narrative  . No narrative on file    Objective:  BP 102/60  Pulse 77  Temp 98.1 F (36.7 C) (Oral)  Resp 16  Wt 153 lb 8 oz (69.627 kg)  SpO2 97%  General appearance: alert, cooperative and appears stated age. cachectic Ears: normal TM's and external ear canals both ears Throat: lips, mucosa, and tongue normal; teeth and gums normal Neck: no adenopathy, no carotid bruit, supple, symmetrical, trachea midline and thyroid not enlarged, symmetric, no tenderness/mass/nodules Back: symmetric, no curvature. ROM normal. No  CVA tenderness. Lungs: clear to auscultation bilaterally Heart: regular rate and rhythm, S1, S2 normal, no murmur, click, rub or gallop Abdomen: soft, non-tender; bowel sounds normal; no masses,  no organomegaly Pulses: 2+ and symmetric Skin:  Nickel-sized stage II decubitus ulcer, right buttock, near natal cleft. Evaluating tissue. No undermining. Tried a urine skin adherent to border at 2:00.  Lymph nodes: Cervical, supraclavicular, and axillary nodes normal.  Assessment and Plan:  Decubitus ulcer of buttock, stage 2 Secondary to malnutrition from metastatic cancer and prolonged sitting. His ulcer does  not show surrounding cellulitis. There is good granulation tissue in the base of it there is some dried skin that is adherent to one border of the ulcer. I recommended that his wife avoid tearing the skin but he should use petroleum jelly on the dried skin and continue night he's a dressing to be changed twice daily. We discussed use of Duoderm but decided against using it. Recommended adding silver Silvadene if she notes swelling of the area from feces. Also recommended shifting weight every 2 hours to prevent recurrent ulcers.  Constipation Currently resolved.   Updated Medication List Outpatient Encounter Prescriptions as of 10/14/2012  Medication Sig Dispense Refill  . abiraterone Acetate (ZYTIGA) 250 MG tablet Take 4 tablets (1,000 mg total) by mouth daily. Take on an empty stomach 1 hour before or 2 hours after a meal  120 tablet  0  . aspirin 81 MG tablet Take 81 mg by mouth daily.      . Calcium Citrate 250 MG TABS Take 3 by mouth twice a day      . Celecoxib (CELEBREX PO) Take by mouth 2 (two) times daily.      . Cholecalciferol (VITAMIN D-3) 5000 UNITS TABS Take one by mouth twice a day.      . Denosumab (XGEVA [place]) One injection monthly.      . dutasteride (AVODART) 0.5 MG capsule Take 0.5 mg by mouth daily.      . fentaNYL (DURAGESIC - DOSED MCG/HR) 12 MCG/HR Place 1 patch onto the skin every 3 (three) days.       . indomethacin (INDOCIN) 50 MG capsule Take by mouth as needed.      Marland Kitchen leuprolide (LUPRON) 22.5 MG injection Inject 22.5 mg into the muscle every 3 (three) months.      . levETIRAcetam (KEPPRA) 500 MG tablet Take 500 mg by mouth every 12 (twelve) hours.      . predniSONE (DELTASONE) 5 MG tablet Take 1 tablet (5 mg total) by mouth 2 (two) times daily.  60 tablet  3  . Tamsulosin HCl (FLOMAX) 0.4 MG CAPS Take 0.4 mg by mouth daily after supper.      . [DISCONTINUED] cabergoline (DOSTINEX) 0.5 MG tablet Take one by mouth on Monday and Thursday.      . [DISCONTINUED] estradiol  (CLIMARA - DOSED IN MG/24 HR) 0.1 mg/24hr Wear 3 patches, add one and remove one daily      . [DISCONTINUED] Loratadine 10 MG CAPS Take one by mouth daily      . [DISCONTINUED] Pomegranate, [place] granatum, (POMEGRANATE PO) Take by mouth.      . [DISCONTINUED] pravastatin (PRAVACHOL) 20 MG tablet Take 1 tablet (20 mg total) by mouth daily.  30 tablet  5  . [DISCONTINUED] Resveratrol 250 MG CAPS Take by mouth.      . [DISCONTINUED] Rosemary Oil OIL Take 3 capsules by mouth twice a day- total of 2400 mg's.      . [  DISCONTINUED] TURMERIC CURCUMIN PO Take 400 mg's daily      . [DISCONTINUED] ursodiol (ACTIGALL) 300 MG capsule Take 300 mg by mouth 2 (two) times daily.      . sodium chloride irrigation 0.9 % irrigation Irrigate with 1,000 mLs as directed once. Pharmacist please substitute a 1 liter bollte of saline solution to use to flush wound   10 cc syringes   Package of 100  1000 mL  2  . [DISCONTINUED] ciprofloxacin (CIPRO) 250 MG tablet Take 1 tablet (250 mg total) by mouth 2 (two) times daily.  28 tablet  0  . [DISCONTINUED] Lecithin 1200 MG CAPS Take one by mouth daily         No orders of the defined types were placed in this encounter.    No Follow-up on file.

## 2012-10-14 NOTE — Patient Instructions (Addendum)
Stop the hydrogen peroxide  Use Sterile saline to clean the wound.  Use 10 cc syringes to direct the stream of saline  Apply petroleum jelly to dry skin and then apply telfa  Or other nonstick paid  Change whenever he has a bowel movement or twice daily   Try to shift weight every two hours to the other hip to offload the area.

## 2012-10-15 ENCOUNTER — Encounter: Payer: Self-pay | Admitting: Internal Medicine

## 2012-10-15 DIAGNOSIS — L89302 Pressure ulcer of unspecified buttock, stage 2: Secondary | ICD-10-CM | POA: Insufficient documentation

## 2012-10-15 DIAGNOSIS — K5900 Constipation, unspecified: Secondary | ICD-10-CM | POA: Insufficient documentation

## 2012-10-15 NOTE — Assessment & Plan Note (Signed)
Secondary to malnutrition from metastatic cancer and prolonged sitting. His ulcer does not show surrounding cellulitis. There is good granulation tissue in the base of it there is some dried skin that is adherent to one border of the ulcer. I recommended that his wife avoid tearing the skin but he should use petroleum jelly on the dried skin and continue night he's a dressing to be changed twice daily. We discussed use of Duoderm but decided against using it. Recommended adding silver Silvadene if she notes swelling of the area from feces. Also recommended shifting weight every 2 hours to prevent recurrent ulcers.

## 2012-10-15 NOTE — Assessment & Plan Note (Signed)
Currently resolved. °

## 2012-10-27 ENCOUNTER — Telehealth: Payer: Self-pay | Admitting: Internal Medicine

## 2012-10-27 ENCOUNTER — Emergency Department: Payer: Self-pay | Admitting: Emergency Medicine

## 2012-10-27 DIAGNOSIS — M79669 Pain in unspecified lower leg: Secondary | ICD-10-CM

## 2012-10-27 DIAGNOSIS — C61 Malignant neoplasm of prostate: Secondary | ICD-10-CM

## 2012-10-27 NOTE — Telephone Encounter (Signed)
Patient Information:  Caller Name: Kathie Rhodes  Phone: [phone]  Patient: Todd Pierce, Todd Pierce  Gender: Male  DOB: April 27, 1930  Age: 79 Years  PCP: Duncan Dull (Adults only)  Office Follow Up:  Does the office need to follow up with this patient?: Yes  Instructions For The Office: Please call ASAP to advise MD order for ED or immediate office visit.  RN Note:  Notes compression hose was found folded down 4" below knee with significant edema above and below the top of the hose on 10/25/12.  Significant leg edema noted this morning, Limps when walks due to calf and thigh pain.  Refused to go to ED even when advised all appointments are full.  Will go only if MD orders it.  Please call back ASAP.  Symptoms  Reason For Call & Symptoms: Pain in right calf or thigh when stands or walks.  Leg remains very swollen  Reviewed Health History In EMR: Yes  Reviewed Medications In EMR: Yes  Reviewed Allergies In EMR: Yes  Reviewed Surgeries / Procedures: Yes  Date of Onset of Symptoms: 10/26/2012  Treatments Tried: Elevated leg reduces edema  Treatments Tried Worked: No  Guideline(s) Used:  Leg Pain  Leg Swelling and Edema  Disposition Per Guideline:   Go to ED Now (or to Office with PCP Approval)  Reason For Disposition Reached:   Thigh or calf pain and only 1 side and present > 1 hour  Advice Given:  N/A  Patient Refused Recommendation:  Patient Will Follow Up With Office Later  Declined to go to ED unless MD says he absolutely must go.  Advised the appointments are full and message sent to MD for immediate call back.

## 2012-10-27 NOTE — Telephone Encounter (Signed)
His LE swelling could be due to a DVT, which cannot be diagnosed without a lower extremity venous doppler.  I can order that for tomorrow, but if he is short of breath he should go to the ER immediately.

## 2012-10-27 NOTE — Telephone Encounter (Signed)
Patient notified. Patient stated that he had already been to er and they did an ultra sound.

## 2012-12-01 ENCOUNTER — Ambulatory Visit: Payer: Medicare Other | Admitting: Internal Medicine

## 2012-12-15 ENCOUNTER — Ambulatory Visit: Payer: Self-pay | Admitting: Internal Medicine

## 2012-12-16 ENCOUNTER — Ambulatory Visit (INDEPENDENT_AMBULATORY_CARE_PROVIDER_SITE_OTHER): Payer: 59 | Admitting: Internal Medicine

## 2012-12-16 ENCOUNTER — Ambulatory Visit: Payer: 59

## 2012-12-16 ENCOUNTER — Encounter: Payer: Self-pay | Admitting: Internal Medicine

## 2012-12-16 VITALS — BP 100/60 | HR 79 | Temp 98.0°F | Resp 15 | Wt 151.0 lb

## 2012-12-16 DIAGNOSIS — L89302 Pressure ulcer of unspecified buttock, stage 2: Secondary | ICD-10-CM

## 2012-12-16 DIAGNOSIS — L89309 Pressure ulcer of unspecified buttock, unspecified stage: Secondary | ICD-10-CM

## 2012-12-16 DIAGNOSIS — Z86010 Personal history of colonic polyps: Secondary | ICD-10-CM

## 2012-12-16 DIAGNOSIS — D649 Anemia, unspecified: Secondary | ICD-10-CM

## 2012-12-16 DIAGNOSIS — L8992 Pressure ulcer of unspecified site, stage 2: Secondary | ICD-10-CM

## 2012-12-16 LAB — CBC WITH DIFFERENTIAL/PLATELET
Basophils Absolute: 0 10*3/uL (ref 0.0–0.1)
Basophils Absolute: 0.1 10*3/uL (ref 0.0–0.1)
Eosinophils Absolute: 0 10*3/uL (ref 0.0–0.7)
Eosinophils Relative: 0.2 % (ref 0.0–5.0)
Eosinophils Relative: 0 % (ref 0–5)
HCT: 28.1 % — ABNORMAL LOW (ref 39.0–52.0)
Lymphocytes Relative: 4 % — ABNORMAL LOW (ref 12–46)
Lymphs Abs: 0.7 10*3/uL (ref 0.7–4.0)
Lymphs Abs: 0.6 10*3/uL — ABNORMAL LOW (ref 0.7–4.0)
MCV: 91.1 fl (ref 78.0–100.0)
Monocytes Absolute: 0.4 10*3/uL (ref 0.1–1.0)
Neutro Abs: 13.7 10*3/uL — ABNORMAL HIGH (ref 1.7–7.7)
Neutrophils Relative %: 92.1 % — ABNORMAL HIGH (ref 43.0–77.0)
Neutrophils Relative %: 92 % — ABNORMAL HIGH (ref 43–77)
Platelets: 197 10*3/uL (ref 150.0–400.0)
Platelets: 202 10*3/uL (ref 150–400)
RBC: 3.21 MIL/uL — ABNORMAL LOW (ref 4.22–5.81)
RDW: 20.1 % — ABNORMAL HIGH (ref 11.5–14.6)
RDW: 18.8 % — ABNORMAL HIGH (ref 11.5–15.5)
WBC: 14.9 10*3/uL — ABNORMAL HIGH (ref 4.5–10.5)
WBC: 14.8 10*3/uL — ABNORMAL HIGH (ref 4.0–10.5)

## 2012-12-16 MED ORDER — BAZA PROTECT EX CREA: TOPICAL_CREAM | Freq: Two times a day (BID) | CUTANEOUS | Status: AC | PRN

## 2012-12-16 NOTE — Assessment & Plan Note (Addendum)
3 week drop in hgb .9 to 8.9  Last week .  Transfused in Feb 2014 at [facility] 3 units of PRBCs.  He received Procit and iron infusion .  Marland Kitchen  Will reschedule appt wit Dr. Orlie Dakin for local management of anemia. hgb is 9.5 today.

## 2012-12-16 NOTE — Patient Instructions (Addendum)
Please sign up for mychart so I can send you your lab results via e mail  We will repeat the referral to Dr. Orlie Dakin for local follow up on your anemia and Neulasta infusions

## 2012-12-16 NOTE — Assessment & Plan Note (Signed)
Recent colonoscopy was normal,  Done because of severe symptomatic anemia

## 2012-12-16 NOTE — Assessment & Plan Note (Signed)
No resolved with use of barrier cream.

## 2012-12-16 NOTE — Progress Notes (Signed)
Patient ID: Todd Pierce, male   DOB: [birth year], 79 y.o.   MRN: [id number]   Patient Active Problem List  Diagnosis  . Coronary artery disease, non-occlusive  . Hypertension  . Hyperlipidemia  . Seizures  . Prostate cancer, primary, with metastasis from prostate to other site  . Family history of colon cancer requiring screening colonoscopy  . Anemia  . Hx of adenomatous colonic polyps  . Diverticulosis  . Urinary frequency  . Steroid-induced adrenal suppression  . Decubitus ulcer of buttock, stage 2  . Constipation    Subjective:  CC:   Chief Complaint  Patient presents with  . Follow-up    HPI:   Todd Dupreeis a 79 y.o. male who presents Anemia. Patient was hospitalized in mid November for severe symptomatic anemia .  He received 3 units of PRBCs and underwent EGD/colonoscopy which were negative for source of bleeding.  Received Procrit and iron infusions. Weekly cbcs have been ordered but he needs to have them in a doctor's office or his insurance  will not pay.  He has become housebound due to worsening weakness and neuropathy from chemotherapy started for metastatic prostate CA. Each sessionis followed by Neulastin infusion  , which has made driving to [place] twice weekly time consuming.    Past Medical History  Diagnosis Date  . prostate cancer   . Coronary artery disease, non-occlusive 2002    95% LAD lesion, managed medically  . Hypertension   . Hyperlipidemia   . Seizures   . Hx of adenomatous colonic polyps 2004    none seen on 2007bcolonoscopy  . Diverticulosis 2007    Past Surgical History  Procedure Laterality Date  . Prostate surgery         The following portions of the patient's history were reviewed and updated as appropriate: Allergies, current medications, and problem list.    Review of Systems:   Patient denies headache, fevers, malaise, unintentional weight loss, skin rash, eye pain, sinus congestion and sinus pain, sore throat,  dysphagia,  hemoptysis , cough, dyspnea, wheezing, chest pain, palpitations, orthopnea, edema, abdominal pain, nausea, melena, diarrhea, constipation, flank pain, dysuria, hematuria, urinary  Frequency, nocturia, numbness, tingling, seizures,  Focal weakness, Loss of consciousness,  Tremor, insomnia, depression, anxiety, and suicidal ideation.        History   Social History  . Marital Status: Married    Spouse Name: N/A    Number of Children: N/A  . Years of Education: N/A   Occupational History  . Not on file.   Social History Main Topics  . Smoking status: Former Smoker    Types: Cigarettes    Quit date: 05/19/1962  . Smokeless tobacco: Former Neurosurgeon    Quit date: 05/19/1952  . Alcohol Use: Yes  . Drug Use: No  . Sexually Active: Not on file   Other Topics Concern  . Not on file   Social History Narrative  . No narrative on file    Objective:  BP 100/60  Pulse 79  Temp(Src) 98 F (36.7 C) (Oral)  Resp 15  Wt 151 lb (68.493 kg)  BMI 22.29 kg/m2  SpO2 97%  General appearance: alert, cooperative and appears stated age Ears: normal TM's and external ear canals both ears Throat: lips, mucosa, and tongue normal; teeth and gums normal Neck: no adenopathy, no carotid bruit, supple, symmetrical, trachea midline and thyroid not enlarged, symmetric, no tenderness/mass/nodules Back: symmetric, no curvature. ROM normal. No CVA tenderness. Lungs: clear to auscultation bilaterally Heart:  regular rate and rhythm, S1, S2 normal, no murmur, click, rub or gallop Abdomen: soft, non-tender; bowel sounds normal; no masses,  no organomegaly Pulses: 2+ and symmetric Skin: Skin color, texture, turgor normal. No rashes or lesions Lymph nodes: Cervical, supraclavicular, and axillary nodes normal.  Assessment and Plan:  Hx of adenomatous colonic polyps Recent colonoscopy was normal,  Done because of severe symptomatic anemia  Anemia 3 week drop in hgb .9 to 8.9  Last week .   Transfused in Feb 2014 at [facility] med 3 units of PRBCs.  He received Procit and iron infusion .  Marland Kitchen  Will reschedule appt wit Dr. Orlie Dakin for local management of anemia. hgb is 9.5 today.  Decubitus ulcer of buttock, stage 2 No resolved with use of barrier cream.    Updated Medication List Outpatient Encounter Prescriptions as of 12/16/2012  Medication Sig Dispense Refill  . abiraterone Acetate (ZYTIGA) 250 MG tablet Take 4 tablets (1,000 mg total) by mouth daily. Take on an empty stomach 1 hour before or 2 hours after a meal  120 tablet  0  . aspirin 81 MG tablet Take 81 mg by mouth daily.      . Calcium Citrate 250 MG TABS Take 3 by mouth twice a day      . Celecoxib (CELEBREX PO) Take by mouth 2 (two) times daily.      . Cholecalciferol (VITAMIN D-3) 5000 UNITS TABS Take one by mouth twice a day.      . Denosumab (XGEVA [place]) One injection monthly.      . dutasteride (AVODART) 0.5 MG capsule Take 0.5 mg by mouth daily.      . fentaNYL (DURAGESIC - DOSED MCG/HR) 12 MCG/HR Place 1 patch onto the skin every 3 (three) days.       . indomethacin (INDOCIN) 50 MG capsule Take by mouth as needed.      Marland Kitchen leuprolide (LUPRON) 22.5 MG injection Inject 22.5 mg into the muscle every 3 (three) months.      . levETIRAcetam (KEPPRA) 500 MG tablet Take 500 mg by mouth every 12 (twelve) hours.      . predniSONE (DELTASONE) 5 MG tablet Take 1 tablet (5 mg total) by mouth 2 (two) times daily.  60 tablet  3  . Skin Protectants, Misc. (DIMETHICONE-ZINC OXIDE) cream Apply topically 2 (two) times daily as needed for dry skin.  142 g  0  . sodium chloride irrigation 0.9 % irrigation Irrigate with 1,000 mLs as directed once. Pharmacist please substitute a 1 liter bollte of saline solution to use to flush wound   10 cc syringes   Package of 100  1000 mL  2  . sulfamethoxazole-trimethoprim (BACTRIM DS) 800-160 MG per tablet       . Tamsulosin HCl (FLOMAX) 0.4 MG CAPS Take 0.4 mg by mouth daily after supper.       No  facility-administered encounter medications on file as of 12/16/2012.     Orders Placed This Encounter  Procedures  . CBC with Differential  . Ambulatory referral to Hematology    No Follow-up on file.

## 2012-12-17 ENCOUNTER — Encounter: Payer: Self-pay | Admitting: Internal Medicine

## 2013-01-05 ENCOUNTER — Ambulatory Visit: Payer: Self-pay | Admitting: Oncology

## 2013-01-05 LAB — CBC CANCER CENTER
Basophil #: 0.1 x10 3/mm (ref 0.0–0.1)
Basophil %: 0.6 %
Eosinophil %: 0.1 %
HCT: 32.1 % — ABNORMAL LOW (ref 40.0–52.0)
Lymphocyte %: 3.3 %
MCH: 31 pg (ref 26.0–34.0)
MCHC: 33.1 g/dL (ref 32.0–36.0)
MCV: 94 fL (ref 80–100)
Monocyte #: 0.4 x10 3/mm (ref 0.2–1.0)
RBC: 3.42 10*6/uL — ABNORMAL LOW (ref 4.40–5.90)
RDW: 20.7 % — ABNORMAL HIGH (ref 11.5–14.5)

## 2013-01-05 LAB — LACTATE DEHYDROGENASE: LDH: 578 U/L — ABNORMAL HIGH (ref 85–241)

## 2013-01-05 LAB — IRON AND TIBC
Iron Bind.Cap.(Total): 214 ug/dL — ABNORMAL LOW (ref 250–450)
Iron Saturation: 81 %
Iron: 174 ug/dL (ref 65–175)
Unbound Iron-Bind.Cap.: 40 ug/dL

## 2013-01-05 LAB — FERRITIN: Ferritin (ARMC): 2933 ng/mL — ABNORMAL HIGH (ref 8–388)

## 2013-01-12 LAB — CBC CANCER CENTER
Eosinophil #: 0 x10 3/mm (ref 0.0–0.7)
HCT: 31 % — ABNORMAL LOW (ref 40.0–52.0)
Lymphocyte #: 0.6 x10 3/mm — ABNORMAL LOW (ref 1.0–3.6)
Lymphocyte %: 2.9 %
MCH: 31 pg (ref 26.0–34.0)
Monocyte #: 0.5 x10 3/mm (ref 0.2–1.0)
Neutrophil #: 18.1 x10 3/mm — ABNORMAL HIGH (ref 1.4–6.5)
RBC: 3.29 10*6/uL — ABNORMAL LOW (ref 4.40–5.90)

## 2013-01-18 ENCOUNTER — Other Ambulatory Visit (INDEPENDENT_AMBULATORY_CARE_PROVIDER_SITE_OTHER): Payer: 59

## 2013-01-18 ENCOUNTER — Encounter: Payer: Self-pay | Admitting: Internal Medicine

## 2013-01-18 ENCOUNTER — Encounter: Payer: Self-pay | Admitting: *Deleted

## 2013-01-18 ENCOUNTER — Telehealth: Payer: Self-pay | Admitting: *Deleted

## 2013-01-18 DIAGNOSIS — R350 Frequency of micturition: Secondary | ICD-10-CM

## 2013-01-18 DIAGNOSIS — N390 Urinary tract infection, site not specified: Secondary | ICD-10-CM

## 2013-01-18 LAB — POCT URINALYSIS DIPSTICK
Glucose, UA: NEGATIVE
Nitrite, UA: NEGATIVE
Protein, UA: 30
Spec Grav, UA: 1.025
Urobilinogen, UA: 0.2
pH, UA: 6

## 2013-01-18 MED ORDER — CIPROFLOXACIN HCL 250 MG PO TABS: 250.0000 mg | ORAL_TABLET | Freq: Two times a day (BID) | ORAL | Status: DC

## 2013-01-18 MED ORDER — SULFAMETHOXAZOLE-TMP DS 800-160 MG PO TABS: 1.0000 | ORAL_TABLET | Freq: Two times a day (BID) | ORAL | Status: AC

## 2013-01-18 NOTE — Telephone Encounter (Signed)
Called pharmacy, Cipro Rx cancelled. Pt's wife had already picked up Septra DS.

## 2013-01-18 NOTE — Telephone Encounter (Signed)
Called patient as instructed and was informed patient cannot take Cipro or penicillin   please advise.

## 2013-01-18 NOTE — Assessment & Plan Note (Signed)
New onset.  UA and culture pending .  cipro sent to pharmacy

## 2013-01-18 NOTE — Telephone Encounter (Signed)
Yes,  Will send rx for cipro to pharmacy so she can b]pick up today during trip.  We will contact her by end of day to tell her whether to start it or not.

## 2013-01-18 NOTE — Telephone Encounter (Signed)
Would like to know he can bring in a urine states having symptoms of UTI and has specimen bottle.

## 2013-01-18 NOTE — Telephone Encounter (Signed)
Please call the pharmacy and cancel the cipro.  i have sent septra DS in instead. If he can't take that, I suggest we hold off on treating until the culture comes back.

## 2013-01-20 ENCOUNTER — Telehealth: Payer: Self-pay

## 2013-01-20 LAB — URINE CULTURE: Organism ID, Bacteria: NO GROWTH

## 2013-01-20 NOTE — Telephone Encounter (Signed)
Notified patient that his urine showed no signs of infection. And that his does  not need an antibiotic.

## 2013-01-28 ENCOUNTER — Ambulatory Visit: Payer: Self-pay | Admitting: Oncology

## 2013-02-09 LAB — CBC CANCER CENTER
Basophil %: 0.1 %
Eosinophil %: 0 %
HGB: 10.6 g/dL — ABNORMAL LOW (ref 13.0–18.0)
Lymphocyte #: 0.3 x10 3/mm — ABNORMAL LOW (ref 1.0–3.6)
Monocyte #: 0.5 x10 3/mm (ref 0.2–1.0)
Neutrophil #: 11 x10 3/mm — ABNORMAL HIGH (ref 1.4–6.5)
Platelet: 99 x10 3/mm — ABNORMAL LOW (ref 150–440)

## 2013-02-23 LAB — CBC CANCER CENTER
Basophil %: 0.3 %
Eosinophil #: 0 x10 3/mm (ref 0.0–0.7)
Lymphocyte #: 0.2 x10 3/mm — ABNORMAL LOW (ref 1.0–3.6)
MCHC: 34.4 g/dL (ref 32.0–36.0)
MCV: 101 fL — ABNORMAL HIGH (ref 80–100)
Monocyte #: 0.2 x10 3/mm (ref 0.2–1.0)
Monocyte %: 3.1 %
Neutrophil #: 6.5 x10 3/mm (ref 1.4–6.5)
Neutrophil %: 94 %
Platelet: 66 x10 3/mm — ABNORMAL LOW (ref 150–440)
RBC: 2.74 10*6/uL — ABNORMAL LOW (ref 4.40–5.90)
WBC: 6.9 x10 3/mm (ref 3.8–10.6)

## 2013-02-26 ENCOUNTER — Ambulatory Visit: Payer: Self-pay

## 2013-02-28 ENCOUNTER — Ambulatory Visit: Payer: Self-pay | Admitting: Oncology

## 2013-03-02 LAB — CBC CANCER CENTER
Basophil #: 0 x10 3/mm (ref 0.0–0.1)
Eosinophil %: 0 %
HGB: 8.5 g/dL — ABNORMAL LOW (ref 13.0–18.0)
Lymphocyte #: 0.2 x10 3/mm — ABNORMAL LOW (ref 1.0–3.6)
Lymphocyte %: 1.2 %
MCH: 34.7 pg — ABNORMAL HIGH (ref 26.0–34.0)
Monocyte #: 0.3 x10 3/mm (ref 0.2–1.0)
Monocyte %: 2.4 %
Neutrophil #: 12.1 x10 3/mm — ABNORMAL HIGH (ref 1.4–6.5)
Neutrophil %: 96 %
Platelet: 85 x10 3/mm — ABNORMAL LOW (ref 150–440)
RBC: 2.45 10*6/uL — ABNORMAL LOW (ref 4.40–5.90)
RDW: 22.7 % — ABNORMAL HIGH (ref 11.5–14.5)
WBC: 12.6 x10 3/mm — ABNORMAL HIGH (ref 3.8–10.6)

## 2013-03-24 LAB — CBC CANCER CENTER
Basophil #: 0 x10 3/mm (ref 0.0–0.1)
Lymphocyte #: 0.3 x10 3/mm — ABNORMAL LOW (ref 1.0–3.6)
MCH: 34.2 pg — ABNORMAL HIGH (ref 26.0–34.0)
MCV: 100 fL (ref 80–100)
Monocyte #: 0.4 x10 3/mm (ref 0.2–1.0)
RBC: 2.84 10*6/uL — ABNORMAL LOW (ref 4.40–5.90)
RDW: 23 % — ABNORMAL HIGH (ref 11.5–14.5)
WBC: 12.8 x10 3/mm — ABNORMAL HIGH (ref 3.8–10.6)

## 2013-03-30 ENCOUNTER — Ambulatory Visit: Payer: Self-pay | Admitting: Oncology

## 2013-05-05 ENCOUNTER — Other Ambulatory Visit: Payer: Self-pay

## 2013-06-18 ENCOUNTER — Ambulatory Visit: Payer: 59 | Admitting: Internal Medicine

## 2013-06-30 DEATH — deceased

## 2013-08-05 ENCOUNTER — Other Ambulatory Visit: Payer: Self-pay

## 2013-09-10 ENCOUNTER — Telehealth: Payer: Self-pay | Admitting: *Deleted

## 2013-09-10 NOTE — Telephone Encounter (Signed)
Carollee Herter,  Well of course she hung up,  This should not have happened.  Patient's widow received a call to set up his wellness exam!! how this can be prevented in the future ?

## 2013-09-10 NOTE — Telephone Encounter (Signed)
Unidentified male at home number stated that patient passed away in June 18, 2023 & hung up.

## 2013-09-14 NOTE — Telephone Encounter (Signed)
On or around September 4th.,  metastatc prostate CA

## 2013-09-14 NOTE — Telephone Encounter (Signed)
Dr. Darrick Huntsman it looks as if this patient hasn't been marked as deceased, do you know when he might have passed away?  I can send to medical records for them to mark the chart as deceased, but I know that they will ask questions like when, where, etc.

## 2013-09-16 NOTE — Telephone Encounter (Signed)
Carollee Herter,  medical records will most likely need additional information and will research the obituaries for record of his death. The only way to prevent this, is if when the provider is notified  the information needs to be routed to [facility] or Lenard Forth, who are our Clinical biochemist.

## 2013-09-20 NOTE — Telephone Encounter (Signed)
Will you please research the death of this patient and mark as deceased.  Patient's family member received a phone call stating the patient was due for a flu shot or something and she was upset since the patient is deceased.

## 2013-11-02 NOTE — Telephone Encounter (Signed)
Researched and found the patient's obituary. Information to HIM Todd Pierce) to [place] and update the patient's record.

## 2014-12-13 IMAGING — US US EXTREM LOW VENOUS*R*
1 series · 14 of 24 positions shown · non-contrast
Comparison: none

REASON FOR EXAM: swelling, pain, eval for DVT
COMMENTS:

[Series 1: us extrem low venous*right* · 0.09mm/px · 14 of 24 slices shown]
[im 1/24]
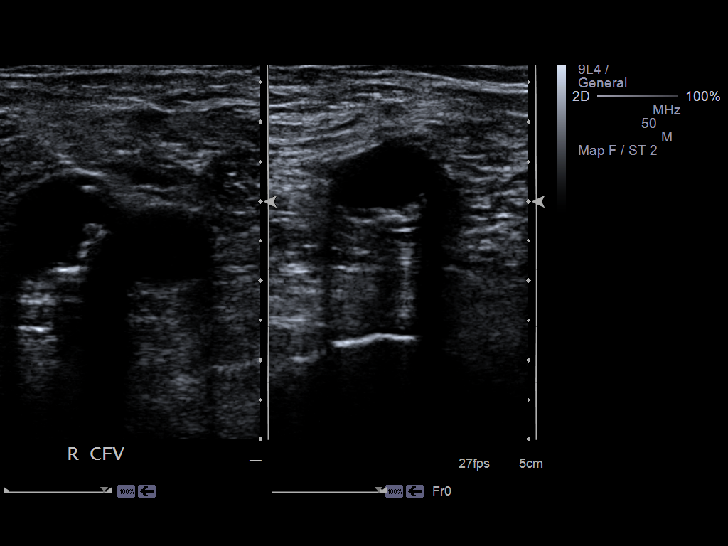
[im 3/24]
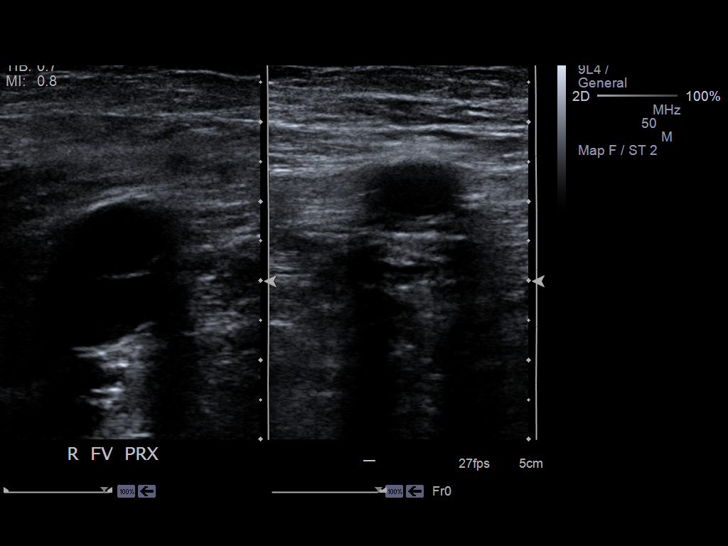
[im 5/24]
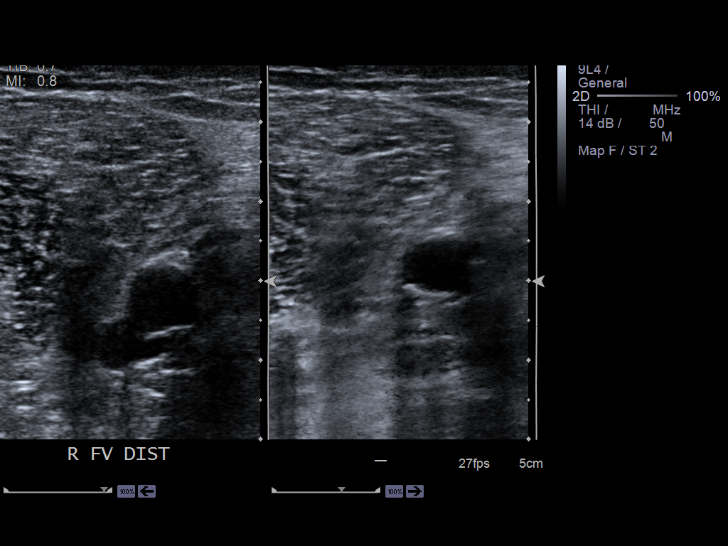
[im 7/24]
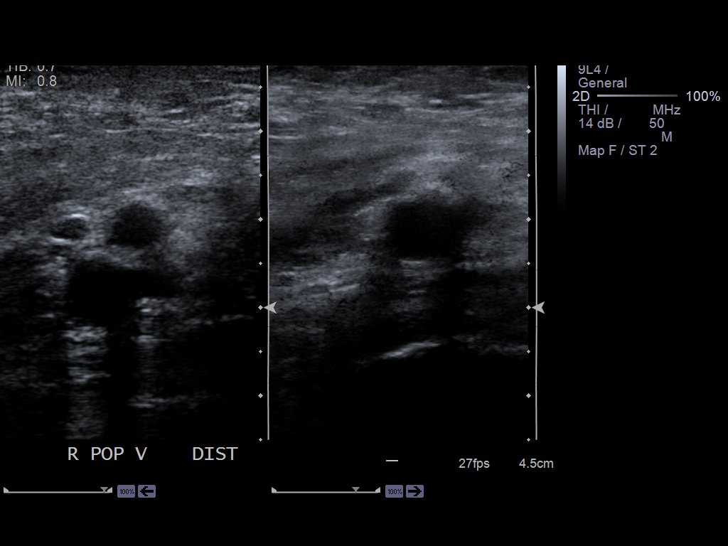
[im 8/24]
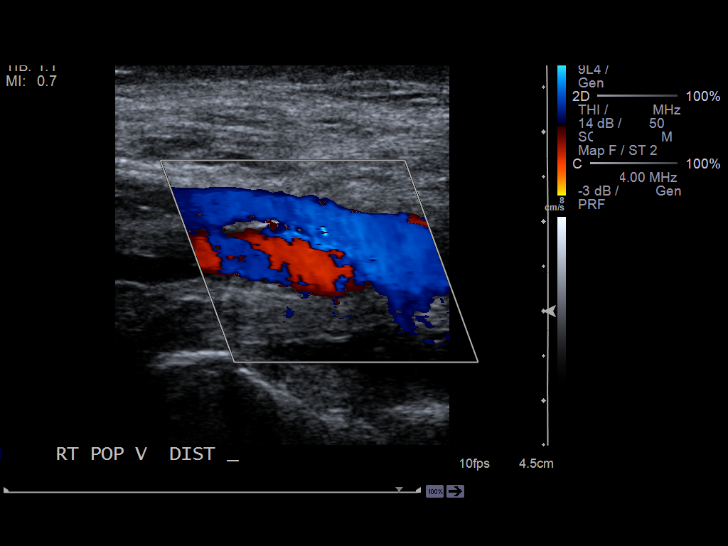
[im 10/24]
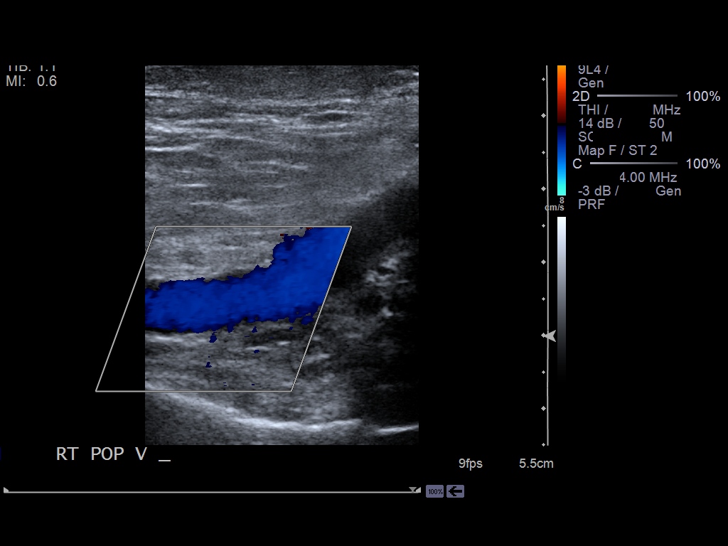
[im 12/24]
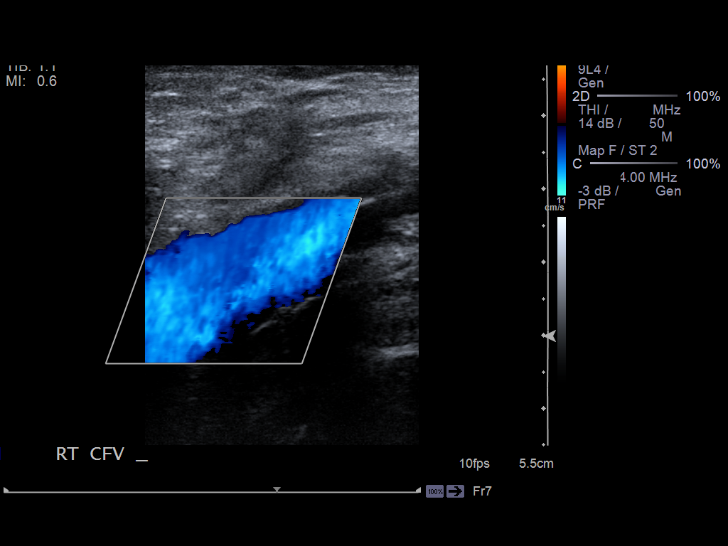
[im 13/24]
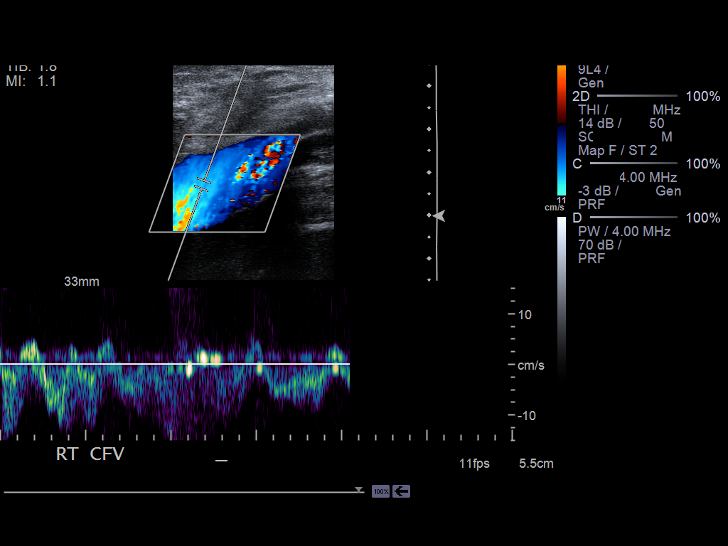
[im 15/24]
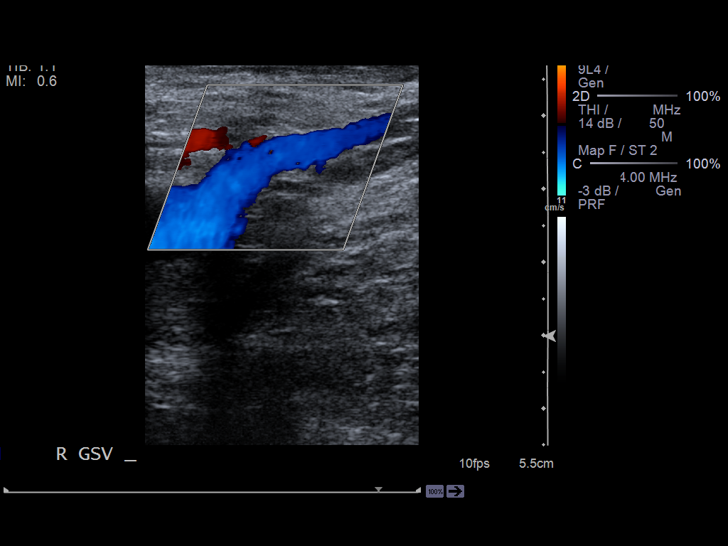
[im 17/24]
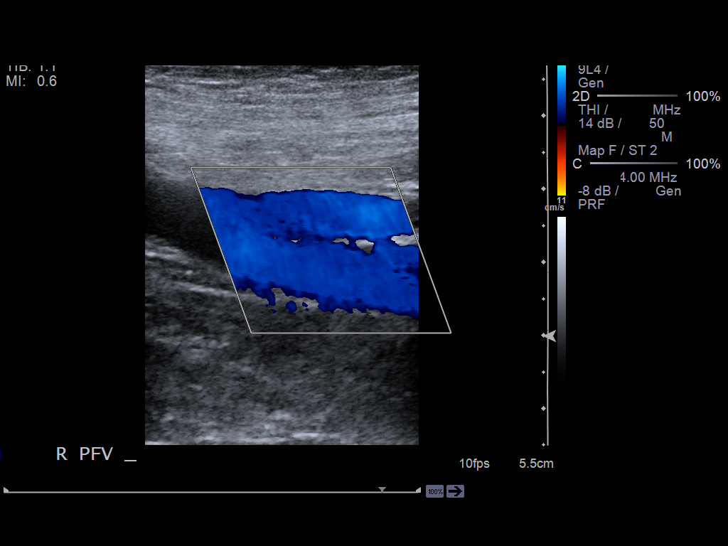
[im 19/24]
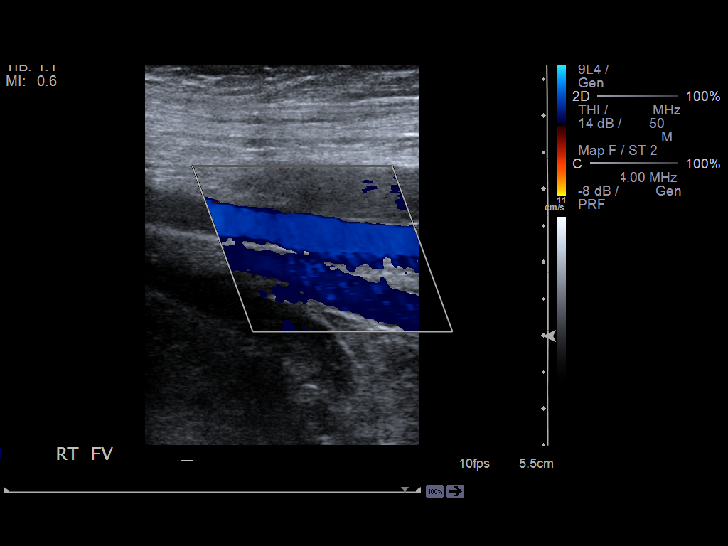
[im 20/24]
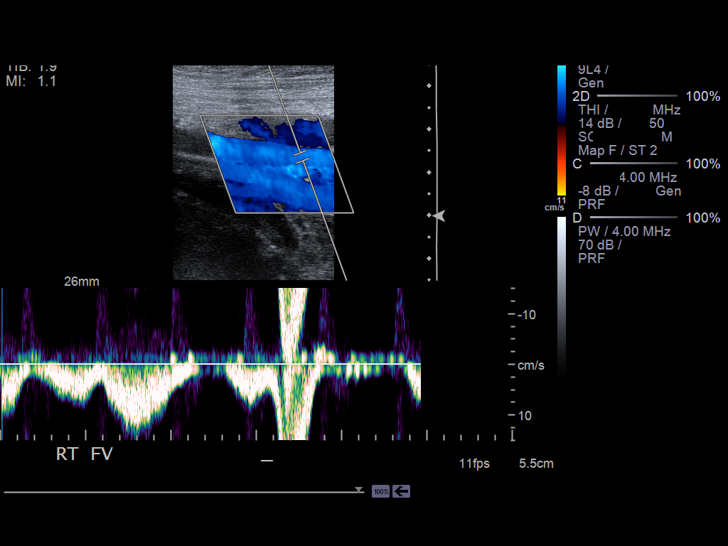
[im 22/24]
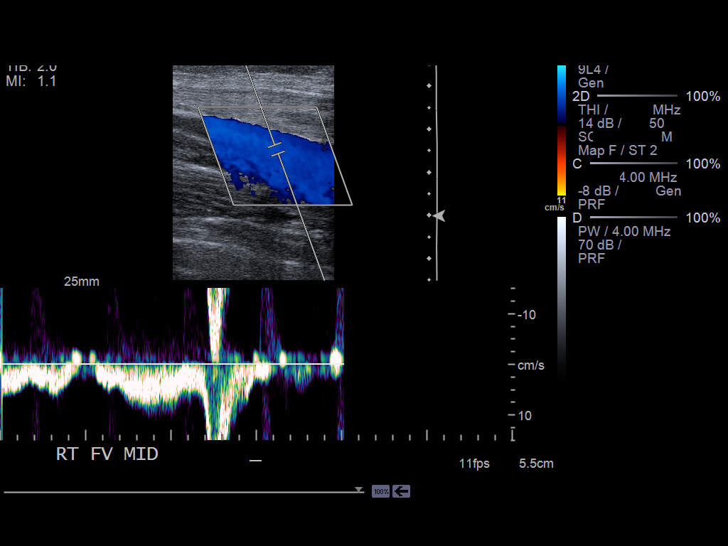
[im 24/24]
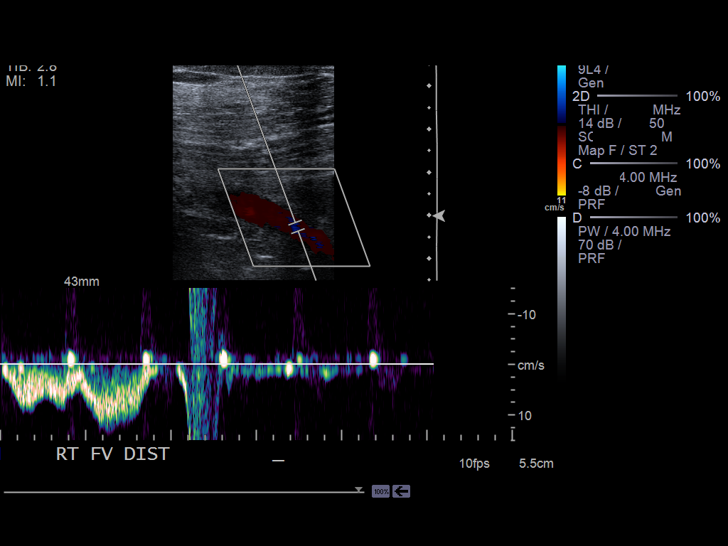

[14 of 24 positions shown; findings below may reference images not displayed]

PROCEDURE:     US  - US DOPPLER LOW EXTR RIGHT  - October 27, 2012 [DATE]

RESULT:     Doppler interrogation of the deep venous system of the right leg
from the common femoral vein through the popliteal vein demonstrates the
compression gray scale images show full compressibility of the deep venous
structures. The color Doppler and spectral Doppler appearance is normal. No
abnormal fluid collection is present.
IMPRESSION: 1. No evidence of right lower extremity deep vein thrombosis.

[REDACTED]
